# Patient Record
Sex: Male | Born: 1995 | Race: White | Hispanic: No | Marital: Single | Smoking: Never smoker
Health system: Southern US, Community
[De-identification: ages and names within clinical notes are randomized; demographics above are authoritative.]

## PROBLEM LIST (undated history)

## (undated) DIAGNOSIS — R569 Unspecified convulsions: Secondary | ICD-10-CM

## (undated) DIAGNOSIS — M419 Scoliosis, unspecified: Secondary | ICD-10-CM

## (undated) HISTORY — DX: Scoliosis, unspecified: M41.9

## (undated) HISTORY — DX: Unspecified convulsions: R56.9

---
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
## 2014-02-04 ENCOUNTER — Ambulatory Visit (INDEPENDENT_AMBULATORY_CARE_PROVIDER_SITE_OTHER): Payer: Medicaid Other | Admitting: Family Medicine

## 2014-02-04 ENCOUNTER — Encounter: Payer: Self-pay | Admitting: Family Medicine

## 2014-02-04 VITALS — BP 127/75 | HR 97 | Temp 98.1°F | Ht 71.5 in | Wt 134.2 lb

## 2014-02-04 DIAGNOSIS — M542 Cervicalgia: Secondary | ICD-10-CM

## 2014-02-04 DIAGNOSIS — M4120 Other idiopathic scoliosis, site unspecified: Secondary | ICD-10-CM

## 2014-02-04 MED ORDER — HYDROCODONE-ACETAMINOPHEN 5-325 MG PO TABS: 1.0000 | ORAL_TABLET | Freq: Four times a day (QID) | ORAL | Status: DC | PRN

## 2014-02-04 NOTE — Progress Notes (Signed)
   Subjective:    Patient ID: Joseph Arias A Doffing, male    DOB: 05/12/1995, 19 y.o.   MRN: [id number]  HPI Patient has hx of severe scoliosis and back pain.  [place]  Review of Systems  Constitutional: Negative for fever.  HENT: Negative for ear pain.   Eyes: Negative for discharge.  Respiratory: Negative for cough.   Cardiovascular: Negative for chest pain.  Gastrointestinal: Negative for abdominal distention.  Endocrine: Negative for polyuria.  Genitourinary: Negative for difficulty urinating.  Musculoskeletal: Negative for gait problem and neck pain.  Skin: Negative for color change and rash.  Neurological: Negative for speech difficulty and headaches.  Psychiatric/Behavioral: Negative for agitation.       Objective:    BP 127/75 mmHg  Pulse 97  Temp(Src) 98.1 F (36.7 C) (Oral)  Ht 5' 11.5" (1.816 m)  Wt 134 lb 3.2 oz (60.873 kg)  BMI 18.46 kg/m2 Physical Exam  Constitutional: He is oriented to person, place, and time. He appears well-developed and well-nourished.  HENT:  Head: Normocephalic and atraumatic.  Mouth/Throat: Oropharynx is clear and moist.  Eyes: Pupils are equal, round, and reactive to light.  Neck: Normal range of motion. Neck supple.  Cardiovascular: Normal rate and regular rhythm.   No murmur heard. Pulmonary/Chest: Effort normal and breath sounds normal.  Abdominal: Soft. Bowel sounds are normal. There is no tenderness.  Musculoskeletal:  TTP thoracic and cervical spine. Decreased ROM thoracic and cervical spine. Scoliosis thoracic spine. Decreased strength with left upper extremity  Neurological: He is alert and oriented to person, place, and time.  Skin: Skin is warm and dry.  Psychiatric: He has a normal mood and affect.          Assessment & Plan:     ICD-9-CM ICD-10-CM   1. Cervicalgia 723.1 M54.2 HYDROcodone-acetaminophen (NORCO) 5-325 MG per tablet  2. Scoliosis (and kyphoscoliosis), idiopathic 737.30  M41.20 HYDROcodone-acetaminophen (NORCO) 5-325 MG per tablet     No Follow-up on file.  Deatra CanterWilliam J Virgen Belland FNP

## 2014-02-05 ENCOUNTER — Telehealth: Payer: Self-pay | Admitting: Family Medicine

## 2014-02-05 ENCOUNTER — Other Ambulatory Visit: Payer: Self-pay | Admitting: Family Medicine

## 2014-02-05 DIAGNOSIS — M5010 Cervical disc disorder with radiculopathy, unspecified cervical region: Secondary | ICD-10-CM

## 2014-02-16 ENCOUNTER — Telehealth: Payer: Self-pay | Admitting: Family Medicine

## 2014-02-18 ENCOUNTER — Other Ambulatory Visit: Payer: Self-pay | Admitting: Family Medicine

## 2014-02-18 ENCOUNTER — Ambulatory Visit (HOSPITAL_COMMUNITY)
Admission: RE | Admit: 2014-02-18 | Discharge: 2014-02-18 | Disposition: A | Discharge status: Home / Self Care | Payer: Medicaid Other | Source: Ambulatory Visit | Attending: Family Medicine | Admitting: Family Medicine

## 2014-02-18 DIAGNOSIS — M5010 Cervical disc disorder with radiculopathy, unspecified cervical region: Secondary | ICD-10-CM

## 2014-02-18 DIAGNOSIS — M542 Cervicalgia: Secondary | ICD-10-CM | POA: Insufficient documentation

## 2014-02-18 DIAGNOSIS — M4120 Other idiopathic scoliosis, site unspecified: Secondary | ICD-10-CM

## 2014-02-18 MED ORDER — HYDROCODONE-ACETAMINOPHEN 5-325 MG PO TABS: 1.0000 | ORAL_TABLET | Freq: Four times a day (QID) | ORAL | Status: DC | PRN

## 2014-02-18 NOTE — Telephone Encounter (Signed)
Was given #30 on 02/04/14

## 2014-03-25 ENCOUNTER — Other Ambulatory Visit: Payer: Self-pay | Admitting: Family Medicine

## 2014-03-25 NOTE — Telephone Encounter (Signed)
Last seen 02/04/14, last filled by [facility] on 02/18/14

## 2014-03-25 NOTE — Telephone Encounter (Signed)
Patient needs to be seen.

## 2014-03-26 NOTE — Telephone Encounter (Signed)
Left message that this isn't a long term medication and that he will need to be seen to address need for refills.

## 2014-04-07 ENCOUNTER — Other Ambulatory Visit: Payer: Self-pay | Admitting: Family Medicine

## 2014-04-07 ENCOUNTER — Ambulatory Visit (INDEPENDENT_AMBULATORY_CARE_PROVIDER_SITE_OTHER): Payer: Medicaid Other | Admitting: Family Medicine

## 2014-04-07 ENCOUNTER — Ambulatory Visit (INDEPENDENT_AMBULATORY_CARE_PROVIDER_SITE_OTHER): Payer: Medicaid Other

## 2014-04-07 ENCOUNTER — Encounter: Payer: Self-pay | Admitting: Family Medicine

## 2014-04-07 VITALS — BP 128/81 | HR 102 | Temp 97.3°F | Ht 71.0 in | Wt 131.0 lb

## 2014-04-07 DIAGNOSIS — S20219A Contusion of unspecified front wall of thorax, initial encounter: Secondary | ICD-10-CM

## 2014-04-07 MED ORDER — NABUMETONE 500 MG PO TABS: 1000.0000 mg | ORAL_TABLET | Freq: Two times a day (BID) | ORAL | Status: DC | PRN

## 2014-04-07 NOTE — Progress Notes (Signed)
Subjective:  Patient ID: Joseph Arias, male    DOB: 06/16/1995  Age: 19 y.o. MRN: [id number]  CC: bilateral side pain   HPI Selena BattenCody A Ovens presents for 4 days ago, March 12, onset of bilateral chest pain. Patient is rather evasive about how this occurred but apparently he fell when someone fell on top of him and may have hit him. It is painful to take a deep breath. Painful to touch. Painful for motion. Denies any hematemesis or hemoptysis. He has not taken anything to give relief. He denies having had any seizure activity.   History Selena BattenCody has a past medical history of Seizures and Scoliosis.   He has no past surgical history on file.   His family history includes Hypertension in his mother.He reports that he has never smoked. He does not have any smokeless tobacco history on file. He reports that he does not drink alcohol or use illicit drugs.  Current Outpatient Prescriptions on File Prior to Visit  Medication Sig Dispense Refill  . divalproex (DEPAKOTE ER) 500 MG 24 hr tablet Take 1,500 mg by mouth daily.    Marland Kitchen. LAMICTAL XR 100 MG TB24   11  . HYDROcodone-acetaminophen (NORCO) 5-325 MG per tablet Take 1 tablet by mouth every 6 (six) hours as needed for moderate pain. (Patient not taking: Reported on 04/07/2014) 30 tablet 0   No current facility-administered medications on file prior to visit.    ROS Review of Systems  Constitutional: Negative for fever, chills and diaphoresis.  Respiratory: Negative for cough.   Cardiovascular: Positive for chest pain (pain is located atthe anterior axillary line on the right at rib 10-11. On the left. Pain is also in the anterior axillary line at approximately 9-10. There is point tenderness in each of these areas. There is no visible hematoma).  Gastrointestinal: Negative for nausea, vomiting, abdominal pain, diarrhea, constipation, blood in stool and abdominal distention.  Genitourinary: Negative for dysuria, hematuria and flank pain.  Musculoskeletal:  Negative for joint swelling and arthralgias.  Skin: Negative for rash.  Neurological: Negative for dizziness and weakness.  Psychiatric/Behavioral: The patient is not nervous/anxious.     Objective:  BP 128/81 mmHg  Pulse 102  Temp(Src) 97.3 F (36.3 C) (Oral)  Ht 5\' 11"  (1.803 m)  Wt 131 lb (59.421 kg)  BMI 18.28 kg/m2  Physical Exam  Constitutional: He appears well-developed and well-nourished.  HENT:  Head: Normocephalic and atraumatic.  Right Ear: Tympanic membrane and external ear normal. No decreased hearing is noted.  Left Ear: Tympanic membrane and external ear normal. No decreased hearing is noted.  Nose: Mucosal edema present. Right sinus exhibits no frontal sinus tenderness. Left sinus exhibits no frontal sinus tenderness.  Mouth/Throat: No oropharyngeal exudate or posterior oropharyngeal erythema.  Neck: No Brudzinski's sign noted.  Pulmonary/Chest: Breath sounds normal. No respiratory distress. He exhibits tenderness (tender in the previously described areas. No visible hematoma.).  Lymphadenopathy:       Head (right side): No preauricular adenopathy present.       Head (left side): No preauricular adenopathy present.       Right cervical: No superficial cervical adenopathy present.      Left cervical: No superficial cervical adenopathy present.    Assessment & Plan:   Selena BattenCody was seen today for bilateral side pain.  Diagnoses and all orders for this visit:  Contusion, chest wall, unspecified laterality, initial encounter Orders: -     Cancel: DG Ribs Unilateral Left; Future -  Cancel: DG Ribs Unilateral Right; Future -     DG Ribs Bilateral W/Chest; Future  Other orders -     nabumetone (RELAFEN) 500 MG tablet; Take 2 tablets (1,000 mg total) by mouth 2 (two) times daily as needed for moderate pain.   I am having Mr. Maeda start on nabumetone. I am also having him maintain his divalproex, LAMICTAL XR, and HYDROcodone-acetaminophen.  Meds ordered this  encounter  Medications  . nabumetone (RELAFEN) 500 MG tablet    Sig: Take 2 tablets (1,000 mg total) by mouth 2 (two) times daily as needed for moderate pain.    Dispense:  60 tablet    Refill:  0   Rib films show no apparent fracture on preliminary reading by me.  Follow-up: Return if symptoms worsen or fail to improve.  Mechele Claude, M.D.

## 2014-04-08 MED ORDER — SULINDAC 150 MG PO TABS: 150.0000 mg | ORAL_TABLET | Freq: Two times a day (BID) | ORAL | Status: DC

## 2014-04-08 MED ORDER — DICLOFENAC SODIUM 75 MG PO TBEC: 75.0000 mg | DELAYED_RELEASE_TABLET | Freq: Two times a day (BID) | ORAL | Status: DC

## 2014-04-08 NOTE — Telephone Encounter (Signed)
Per patient insurance will not pay for nabumetone. Can this med be changed to something else. Please advise and route to [facility]

## 2014-04-08 NOTE — Addendum Note (Signed)
Addended by: Bearl MulberryUTHERFORD, Amante Fomby K on: 04/08/2014 11:39 AM   Modules accepted: Orders

## 2014-04-08 NOTE — Telephone Encounter (Signed)
Patient aware.

## 2014-04-08 NOTE — Telephone Encounter (Signed)
Diclofenac was sent in as a substitute area. Thanks, WS.

## 2015-09-30 ENCOUNTER — Encounter (HOSPITAL_COMMUNITY): Payer: Self-pay | Admitting: Emergency Medicine

## 2015-09-30 ENCOUNTER — Emergency Department (HOSPITAL_COMMUNITY)
Admission: EM | Admit: 2015-09-30 | Discharge: 2015-09-30 | Disposition: A | Discharge status: Home / Self Care | Payer: PRIVATE HEALTH INSURANCE | Attending: Emergency Medicine | Admitting: Emergency Medicine

## 2015-09-30 ENCOUNTER — Emergency Department (HOSPITAL_COMMUNITY): Payer: PRIVATE HEALTH INSURANCE

## 2015-09-30 DIAGNOSIS — Z791 Long term (current) use of non-steroidal anti-inflammatories (NSAID): Secondary | ICD-10-CM | POA: Diagnosis not present

## 2015-09-30 DIAGNOSIS — M542 Cervicalgia: Secondary | ICD-10-CM | POA: Diagnosis present

## 2015-09-30 DIAGNOSIS — M62838 Other muscle spasm: Secondary | ICD-10-CM | POA: Diagnosis not present

## 2015-09-30 DIAGNOSIS — Z79899 Other long term (current) drug therapy: Secondary | ICD-10-CM | POA: Diagnosis not present

## 2015-09-30 MED ORDER — HYDROCODONE-ACETAMINOPHEN 5-325 MG PO TABS
1.0000 | ORAL_TABLET | Freq: Once | ORAL | Status: AC
  Administered 2015-09-30: 1 via ORAL
  Filled 2015-09-30: qty 1

## 2015-09-30 MED ORDER — CYCLOBENZAPRINE HCL 5 MG PO TABS: 5.0000 mg | ORAL_TABLET | Freq: Three times a day (TID) | ORAL | 0 refills | Status: DC | PRN

## 2015-09-30 MED ORDER — HYDROCODONE-ACETAMINOPHEN 5-325 MG PO TABS: 1.0000 | ORAL_TABLET | ORAL | 0 refills | Status: DC | PRN

## 2015-09-30 NOTE — Discharge Instructions (Signed)
You may take the hydrocodone prescribed for pain relief.  This will make you drowsy - do not drive within 4 hours of taking this medication. ° °

## 2015-09-30 NOTE — ED Triage Notes (Signed)
PT c/o waking up with neck pain yesterday morning with no new injury. PT also states he sees a spine specialist every 6 months for scoliosis treatment.

## 2015-09-30 NOTE — ED Provider Notes (Signed)
[facility]-EMER[facility]ENCY DEPT Provider Note   CSN: [id number] Arrival date & time: 09/30/15  1523     History   Chief Complaint Chief Complaint  Patient presents with  . Neck Pain    HPI Joseph Arias is a 20 y.o. male with a history of scoliosis, followed by a back specialist with [facility] yearly, presenting with a 2 day history of right neck pain.  He denies injury, stating he woke yesterday with pain and decreased range of motion.  He denies radiation of pain with pain worsened with right head rotation and also denies fevers, chills, headaches.  He has taken advil, tried heat and ice without improvement.  The history is provided by the patient and a parent.    Past Medical History:  Diagnosis Date  . Scoliosis   . Seizures (HCC)     There are no active problems to display for this patient.   History reviewed. No pertinent surgical history.     Home Medications    Prior to Admission medications   Medication Sig Start Date End Date Taking? Authorizing Provider  divalproex (DEPAKOTE ER) 500 MG 24 hr tablet Take 1,000 mg by mouth at bedtime.    Yes Historical Provider, MD  ibuprofen (ADVIL,MOTRIN) 200 MG tablet Take 800 mg by mouth every 6 (six) hours as needed for mild pain or moderate pain.   Yes Historical Provider, MD  LAMICTAL XR 100 MG TB24 Take 100 mg by mouth at bedtime.  01/18/14  Yes Historical Provider, MD  cyclobenzaprine (FLEXERIL) 5 MG tablet Take 1 tablet (5 mg total) by mouth 3 (three) times daily as needed for muscle spasms. 09/30/15   Burgess Amor, PA-C  HYDROcodone-acetaminophen (NORCO/VICODIN) 5-325 MG tablet Take 1 tablet by mouth every 4 (four) hours as needed. 09/30/15   Burgess Amor, PA-C    Family History Family History  Problem Relation Age of Onset  . Hypertension Mother     Social History Social History  Substance Use Topics  . Smoking status: Never Smoker  . Smokeless tobacco: Never Used  . Alcohol use Yes     Comment: socially     Allergies     Review of patient's allergies indicates no known allergies.   Review of Systems Review of Systems  Constitutional: Negative for chills and fever.  Musculoskeletal: Positive for arthralgias and neck stiffness. Negative for joint swelling and myalgias.  Neurological: Negative for weakness and numbness.     Physical Exam Updated Vital Signs BP 124/67 (BP Location: Left Arm)   Pulse 93   Temp 98.1 F (36.7 C) (Temporal)   Resp 16   Ht 5\' 6"  (1.676 m)   Wt 63.9 kg   SpO2 100%   BMI 22.74 kg/m   Physical Exam  Constitutional: He appears well-developed and well-nourished.  HENT:  Head: Normocephalic.  Eyes: Conjunctivae are normal.  Neck: Normal range of motion. Neck supple.  Cardiovascular: Normal rate and intact distal pulses.   Pedal pulses normal.  Pulmonary/Chest: Effort normal.  Abdominal: Soft. Bowel sounds are normal. He exhibits no distension and no mass.  Musculoskeletal: He exhibits no edema.       Cervical back: He exhibits decreased range of motion, tenderness and spasm. He exhibits no bony tenderness, no swelling and no deformity.       Lumbar back: He exhibits no tenderness, no swelling, no edema and no spasm.  Neurological: He is alert. He has normal strength. He displays no atrophy and no tremor. No sensory  deficit. Gait normal.  Reflex Scores:      Bicep reflexes are 2+ on the right side and 2+ on the left side. No strength deficit noted in hip and knee flexor and extensor muscle groups.  Ankle flexion and extension intact.  Skin: Skin is warm and dry.  Psychiatric: He has a normal mood and affect.  Nursing note and vitals reviewed.    ED Treatments / Results  Labs (all labs ordered are listed, but only abnormal results are displayed) Labs Reviewed - No data to display  EKG  EKG Interpretation None       Radiology Dg Cervical Spine Complete  Result Date: 09/30/2015 CLINICAL DATA:  Neck pain for 2 days, scoliosis history EXAM: CERVICAL SPINE -  COMPLETE 4+ VIEW COMPARISON:  MR cervical spine 02/18/2014 FINDINGS: Prevertebral soft tissues normal thickness. Reversal of cervical lordosis question muscle spasm. Vertebral body and disc space heights maintained. No acute fracture, subluxation, or bone destruction. Bony foramina patent. Lung apices clear. Thoracic levoconvex scoliosis is partially visualized. IMPRESSION: Question muscle spasm ; otherwise negative exam. Electronically Signed   By: Ulyses SouthwardMark  Boles M.D.   On: 09/30/2015 17:13    Procedures Procedures (including critical care time)  Medications Ordered in ED Medications  HYDROcodone-acetaminophen (NORCO/VICODIN) 5-325 MG per tablet 1 tablet (1 tablet Oral Given 09/30/15 1637)     Initial Impression / Assessment and Plan / ED Course  I have reviewed the triage vital signs and the nursing notes.  Pertinent labs & imaging results that were available during my care of the patient were reviewed by me and considered in my medical decision making (see chart for details).  Clinical Course    Imaging reviewed. Pt prescribed flexeril, hydrocodone.  Heat followed by ROM exercises which were demonstrated.  F/u with pcp and/or scoliosis specialist if sx persist or worsen.  Final Clinical Impressions(s) / ED Diagnoses   Final diagnoses:  Muscle spasms of neck    New Prescriptions New Prescriptions   CYCLOBENZAPRINE (FLEXERIL) 5 MG TABLET    Take 1 tablet (5 mg total) by mouth 3 (three) times daily as needed for muscle spasms.   HYDROCODONE-ACETAMINOPHEN (NORCO/VICODIN) 5-325 MG TABLET    Take 1 tablet by mouth every 4 (four) hours as needed.     Burgess AmorJulie Laiah Pouncey, PA-C 09/30/15 1735    Donnetta HutchingBrian Cook, MD 09/30/15 [phone]

## 2016-04-29 ENCOUNTER — Emergency Department (HOSPITAL_COMMUNITY): Payer: 59

## 2016-04-29 ENCOUNTER — Encounter (HOSPITAL_COMMUNITY): Payer: Self-pay | Admitting: Emergency Medicine

## 2016-04-29 ENCOUNTER — Emergency Department (HOSPITAL_COMMUNITY)
Admission: EM | Admit: 2016-04-29 | Discharge: 2016-04-29 | Disposition: A | Discharge status: Home / Self Care | Payer: 59 | Attending: Emergency Medicine | Admitting: Emergency Medicine

## 2016-04-29 DIAGNOSIS — S50811A Abrasion of right forearm, initial encounter: Secondary | ICD-10-CM | POA: Diagnosis not present

## 2016-04-29 DIAGNOSIS — Z791 Long term (current) use of non-steroidal anti-inflammatories (NSAID): Secondary | ICD-10-CM | POA: Insufficient documentation

## 2016-04-29 DIAGNOSIS — Y929 Unspecified place or not applicable: Secondary | ICD-10-CM | POA: Insufficient documentation

## 2016-04-29 DIAGNOSIS — Y999 Unspecified external cause status: Secondary | ICD-10-CM | POA: Insufficient documentation

## 2016-04-29 DIAGNOSIS — W109XXA Fall (on) (from) unspecified stairs and steps, initial encounter: Secondary | ICD-10-CM | POA: Diagnosis not present

## 2016-04-29 DIAGNOSIS — Y939 Activity, unspecified: Secondary | ICD-10-CM | POA: Diagnosis not present

## 2016-04-29 DIAGNOSIS — S59911A Unspecified injury of right forearm, initial encounter: Secondary | ICD-10-CM | POA: Diagnosis present

## 2016-04-29 DIAGNOSIS — W19XXXA Unspecified fall, initial encounter: Secondary | ICD-10-CM

## 2016-04-29 DIAGNOSIS — M79631 Pain in right forearm: Secondary | ICD-10-CM

## 2016-04-29 NOTE — ED Provider Notes (Signed)
[facility] DEPT Provider Note   CSN: [id number] Arrival date & time: 04/29/16  1108     History   Chief Complaint Chief Complaint  Patient presents with  . Fall    HPI Joseph Arias is a 21 y.o. male with history of seizures presents to the ED reporting right forearm and right wrist pain after accidental fall down stairs last night. Patient states that he tried to catch himself and fell onto his outstretched right hand. He denies seizure prior to fall. Pain was immediate and continued overnight. Patient has taken ibuprofen and 100 mg with minimal relief. Patient worried because he noticed a "bump" to the posterior forearm. Previous surgeries or trauma to the right upper extremity. Patient is left-handed. No numbness, weakness or tingling to right UE.  HPI  Past Medical History:  Diagnosis Date  . Scoliosis   . Seizures ([facility])     There are no active problems to display for this patient.   History reviewed. No pertinent surgical history.     Home Medications    Prior to Admission medications   Medication Sig Start Date End Date Taking? Authorizing Provider  divalproex (DEPAKOTE ER) 500 MG 24 hr tablet Take 1,000 mg by mouth at bedtime.    Yes Historical Provider, MD  ibuprofen (ADVIL,MOTRIN) 200 MG tablet Take 800 mg by mouth every 6 (six) hours as needed for mild pain or moderate pain.   Yes Historical Provider, MD  LAMICTAL XR 100 MG TB24 Take 100 mg by mouth at bedtime.  01/18/14  Yes Historical Provider, MD    Family History Family History  Problem Relation Age of Onset  . Hypertension Mother     Social History Social History  Substance Use Topics  . Smoking status: Never Smoker  . Smokeless tobacco: Never Used  . Alcohol use Yes     Comment: socially     Allergies   Patient has no known allergies.   Review of Systems Review of Systems  Musculoskeletal: Positive for arthralgias. Negative for back pain and joint swelling.  Skin: Positive for rash.    Neurological: Negative for seizures, syncope and headaches.     Physical Exam Updated Vital Signs BP 107/73   Pulse (!) 101   Temp 97.5 F (36.4 C) (Oral)   Resp 18   Ht 6' (1.829 m)   Wt 72.7 kg   SpO2 100%   BMI 21.73 kg/m   Physical Exam  Constitutional: He appears well-developed and well-nourished. No distress.  HENT:  Head: Normocephalic and atraumatic.  Eyes: Conjunctivae and EOM are normal.  Neck: Normal range of motion.  Cardiovascular: Normal rate, regular rhythm and normal heart sounds.   No murmur heard. Radial and ulnar pulses strong and symmetric bilaterally  Pulmonary/Chest: Effort normal and breath sounds normal. He has no wheezes.  Musculoskeletal: He exhibits tenderness.       Right forearm: He exhibits tenderness and swelling.  RIGHT UPPER EXTREMITY Point tenderness to ulnar aspect of mid forearm with edema  Full passive ROM of right elbow, wrist and digits Patient able to supinate and pronate forearm, with appropriate tenderness No tenderness at anatomical snuffbox No focal tenderness at distal ulna, radius, olecranon or radial head  Neurological:  RIGHT UPPER EXTREMITY 5/5 strength with elbow flexion and extension 5/5 strength with wrist flexion and extension Strong hand grip and pincer strength Sensation to light touch intact in radial, medial and ulnar distribution   Skin: Abrasion noted.     RIGHT  UPPER EXTREMITY Small abrasion to dorsal aspect of right mid forearm with appropriate tenderness     ED Treatments / Results  Labs (all labs ordered are listed, but only abnormal results are displayed) Labs Reviewed - No data to display  EKG  EKG Interpretation None       Radiology Dg Forearm Right  Result Date: 04/29/2016 CLINICAL DATA:  21 year old male with a history of fall.  Wrist pain EXAM: RIGHT FOREARM - 2 VIEW COMPARISON:  None. FINDINGS: There is no evidence of fracture or other focal bone lesions. Soft tissues are  unremarkable. IMPRESSION: Negative. Electronically Signed   By: Gilmer Mor D.O.   On: 04/29/2016 12:12   Dg Wrist Complete Right  Result Date: 04/29/2016 CLINICAL DATA:  21 year old male with a history of wrist pain after fall EXAM: RIGHT WRIST - COMPLETE 3+ VIEW COMPARISON:  None. FINDINGS: There is no evidence of fracture or dislocation. There is no evidence of arthropathy or other focal bone abnormality. Soft tissues are unremarkable. IMPRESSION: Negative. Electronically Signed   By: Gilmer Mor D.O.   On: 04/29/2016 12:14    Procedures Procedures (including critical care time)  Medications Ordered in ED Medications - No data to display   Initial Impression / Assessment and Plan / ED Course  I have reviewed the triage vital signs and the nursing notes.  Pertinent labs & imaging results that were available during my care of the patient were reviewed by me and considered in my medical decision making (see chart for details).  Clinical Course as of Apr 29 1240  Sat Apr 29, 2016  1220 FINDINGS: There is no evidence of fracture or dislocation. There is no evidence of arthropathy or other focal bone abnormality. Soft tissues are unremarkable.  IMPRESSION: Negative. DG Wrist Complete Right [CG]  1220 FINDINGS: There is no evidence of fracture or other focal bone lesions. Soft tissues are unremarkable.  IMPRESSION: Negative. DG Forearm Right [CG]  1227 Discussed x-ray findings with patient and relative at beside, patient requested wrist brace.  [CG]    Clinical Course User Index [CG] Liberty Handy, PA-C   X-ray negative. RUE neurovascularly intact. Suspect pain secondary to hematoma to dorsal right forearm.  Patient requested wrist brace, given. Ibuprofen, ice, rest, elevate.  Advised to f/u with PCP if pain persists after a week.  Ed return precautions given. Patient and relative at bedside agreeable to plan.   Final Clinical Impressions(s) / ED Diagnoses   Final  diagnoses:  Pain of right forearm  Fall, initial encounter    New Prescriptions New Prescriptions   No medications on file     Jerrell Mylar 04/29/16 1242    Eber Hong, MD 04/29/16 [phone]

## 2016-04-29 NOTE — ED Triage Notes (Signed)
Pt reports falling down the stairs yesterday and catching self with right arm.  Pain from wrist to elbow.

## 2016-04-29 NOTE — Discharge Instructions (Signed)
Your forearm and wrist x-rays were negative today and did not show a fracture dislocation. The focal pain that you're experiencing to the right forearm is likely from a hematoma or bruise. Please take 600 mg of ibuprofen every 6-8 hours for pain. Rest your left right upper extremity. You requested a wrist/arm brace which was provided to you today for comfort. Please wear this for comfort and until you are pain free. Please follow-up with your primary care provider in the next week if your symptoms persist and do not completely go away.  Return to the emergency department if you develop right upper extremity weakness or numbness.

## 2016-04-29 NOTE — ED Notes (Signed)
Pt with right forearm pain after slipping on stairs yesterday and tried to catch self with right hand and arm.  Pt able to make fist and rotate forearm.  No swelling noted but abrasion to forearm.

## 2016-10-27 ENCOUNTER — Ambulatory Visit (INDEPENDENT_AMBULATORY_CARE_PROVIDER_SITE_OTHER): Payer: 59 | Admitting: Family Medicine

## 2016-10-27 ENCOUNTER — Encounter: Payer: Self-pay | Admitting: Family Medicine

## 2016-10-27 VITALS — BP 117/73 | HR 88 | Temp 96.9°F | Ht 72.0 in | Wt 161.0 lb

## 2016-10-27 DIAGNOSIS — R109 Unspecified abdominal pain: Secondary | ICD-10-CM

## 2016-10-27 LAB — URINALYSIS
BILIRUBIN UA: NEGATIVE
Glucose, UA: NEGATIVE
Ketones, UA: NEGATIVE
LEUKOCYTES UA: NEGATIVE
NITRITE UA: NEGATIVE
PH UA: 6 (ref 5.0–7.5)
Protein, UA: NEGATIVE
SPEC GRAV UA: 1.025 (ref 1.005–1.030)
Urobilinogen, Ur: 0.2 mg/dL (ref 0.2–1.0)

## 2016-10-27 NOTE — Progress Notes (Signed)
Subjective:  Patient ID: Joseph Arias, male    DOB: [birth year]  Age: 21 y.o. MRN: [id number]  CC: Flank Pain (left side )   HPI Joseph Arias presents for 2 days of increasing left flank pain. He's been nauseous as well. The pain is a little better when he lays still but it's even more severe when he moves around even to sit up or walk. He says his urine burns and his bowel movements aren't as well he's passed some blood with his bowel movements as well. No previous similar symptoms. He is concerned that he drinks a large amount of red bull is concerned it caused these symptoms.  Depression screen PHQ 2/9 10/27/2016  Decreased Interest 0  Down, Depressed, Hopeless 0  PHQ - 2 Score 0    History Valery has a past medical history of Scoliosis and Seizures ([facility]).   He has no past surgical history on file.   His family history includes Hypertension in his mother.He reports that he has never smoked. He has never used smokeless tobacco. He reports that he drinks alcohol. He reports that he does not use drugs.    ROS Review of Systems  Constitutional: Negative.   Respiratory: Negative.   Cardiovascular: Negative.   Gastrointestinal: Positive for abdominal pain, nausea and rectal pain. Negative for diarrhea and vomiting.  Genitourinary: Positive for flank pain.    Objective:  BP 117/73 (BP Location: Left Arm)   Pulse 88   Temp (!) 96.9 F (36.1 C) (Oral)   Ht 6' (1.829 m)   Wt 161 lb (73 kg)   BMI 21.84 kg/m   BP Readings from Last 3 Encounters:  10/27/16 117/73  04/29/16 107/73  09/30/15 124/67    Wt Readings from Last 3 Encounters:  10/27/16 161 lb (73 kg)  04/29/16 160 lb 3.2 oz (72.7 kg)  09/30/15 140 lb 14.4 oz (63.9 kg)     Physical Exam  Constitutional: He appears well-developed and well-nourished.  HENT:  Head: Normocephalic and atraumatic.  Right Ear: Tympanic membrane and external ear normal. No decreased hearing is noted.  Left Ear: Tympanic membrane and  external ear normal. No decreased hearing is noted.  Mouth/Throat: No oropharyngeal exudate or posterior oropharyngeal erythema.  Eyes: Pupils are equal, round, and reactive to light.  Neck: Normal range of motion. Neck supple.  Cardiovascular: Normal rate and regular rhythm.   No murmur heard. Pulmonary/Chest: Breath sounds normal. No respiratory distress.  Abdominal: Soft. Bowel sounds are normal. He exhibits no mass. There is tenderness (moderate left lower quadrant tendernesssevere at posterior axillary line about rib 11. No visible lesions). There is guarding.  Psychiatric: His affect is blunt. His speech is delayed. He is slowed. Cognition and memory are normal.  Vitals reviewed.    Urinalysis: all parameters nml except trace blood   Assessment & Plan:   Reichen was seen today for flank pain.  Diagnoses and all orders for this visit:  Flank pain -     Urine Culture -     Urinalysis -     CBC with Differential/Platelet -     CMP14+EGFR  Abdominal pain, unspecified abdominal location -     CT RENAL STONE STUDY; Future       I am having Mr. Hadsall maintain his divalproex, LAMICTAL XR, and ibuprofen.  Allergies as of 10/27/2016   No Known Allergies     Medication List       Accurate as of 10/27/16  8:34  PM. Always use your most recent med list.          divalproex 500 MG 24 hr tablet Commonly known as:  DEPAKOTE ER Take 1,000 mg by mouth at bedtime.   ibuprofen 200 MG tablet Commonly known as:  ADVIL,MOTRIN Take 800 mg by mouth every 6 (six) hours as needed for mild pain or moderate pain.   LAMICTAL XR 100 MG Tb24 Generic drug:  LamoTRIgine Take 100 mg by mouth at bedtime.        Follow-up: Return in about 1 week (around 11/03/2016), or if symptoms worsen or fail to improve.  Claretta Fraise, M.D.

## 2016-10-28 LAB — URINE CULTURE

## 2016-10-29 LAB — CMP14+EGFR
ALBUMIN: 4.7 g/dL (ref 3.5–5.5)
ALK PHOS: 55 IU/L (ref 39–117)
ALT: 21 IU/L (ref 0–44)
AST: 21 IU/L (ref 0–40)
Albumin/Globulin Ratio: 1.6 (ref 1.2–2.2)
BUN / CREAT RATIO: 10 (ref 9–20)
BUN: 10 mg/dL (ref 6–20)
Bilirubin Total: 0.3 mg/dL (ref 0.0–1.2)
CO2: 27 mmol/L (ref 20–29)
Calcium: 9.7 mg/dL (ref 8.7–10.2)
Chloride: 97 mmol/L (ref 96–106)
Creatinine, Ser: 0.98 mg/dL (ref 0.76–1.27)
GFR calc Af Amer: 126 mL/min/{1.73_m2} (ref >59)
GFR calc non Af Amer: 109 mL/min/{1.73_m2} (ref >59)
GLOBULIN, TOTAL: 3 g/dL (ref 1.5–4.5)
GLUCOSE: 96 mg/dL (ref 65–99)
POTASSIUM: 4.3 mmol/L (ref 3.5–5.2)
SODIUM: 139 mmol/L (ref 134–144)
Total Protein: 7.7 g/dL (ref 6.0–8.5)

## 2016-10-29 LAB — CBC WITH DIFFERENTIAL/PLATELET
BASOS: 1 %
Basophils Absolute: 0 10*3/uL (ref 0.0–0.2)
EOS (ABSOLUTE): 0.1 10*3/uL (ref 0.0–0.4)
EOS: 2 %
HEMATOCRIT: 46.1 % (ref 37.5–51.0)
HEMOGLOBIN: 15.7 g/dL (ref 13.0–17.7)
IMMATURE GRANS (ABS): 0 10*3/uL (ref 0.0–0.1)
Immature Granulocytes: 0 %
LYMPHS: 36 %
Lymphocytes Absolute: 2.4 10*3/uL (ref 0.7–3.1)
MCH: 32.2 pg (ref 26.6–33.0)
MCHC: 34.1 g/dL (ref 31.5–35.7)
MCV: 95 fL (ref 79–97)
MONOCYTES: 12 %
Monocytes Absolute: 0.8 10*3/uL (ref 0.1–0.9)
NEUTROS ABS: 3.4 10*3/uL (ref 1.4–7.0)
Neutrophils: 49 %
Platelets: 254 10*3/uL (ref 150–379)
RBC: 4.88 x10E6/uL (ref 4.14–5.80)
RDW: 13.8 % (ref 12.3–15.4)
WBC: 6.8 10*3/uL (ref 3.4–10.8)

## 2016-11-07 ENCOUNTER — Ambulatory Visit (HOSPITAL_COMMUNITY): Payer: 59

## 2017-06-01 IMAGING — DX DG WRIST COMPLETE 3+V*R*
4 series · 4 of 4 positions shown · non-contrast
Comparison: None.

CLINICAL DATA: 21-year-old male with a history of wrist pain after
fall

EXAM:
RIGHT WRIST - COMPLETE 3+ VIEW

[wrist pa]
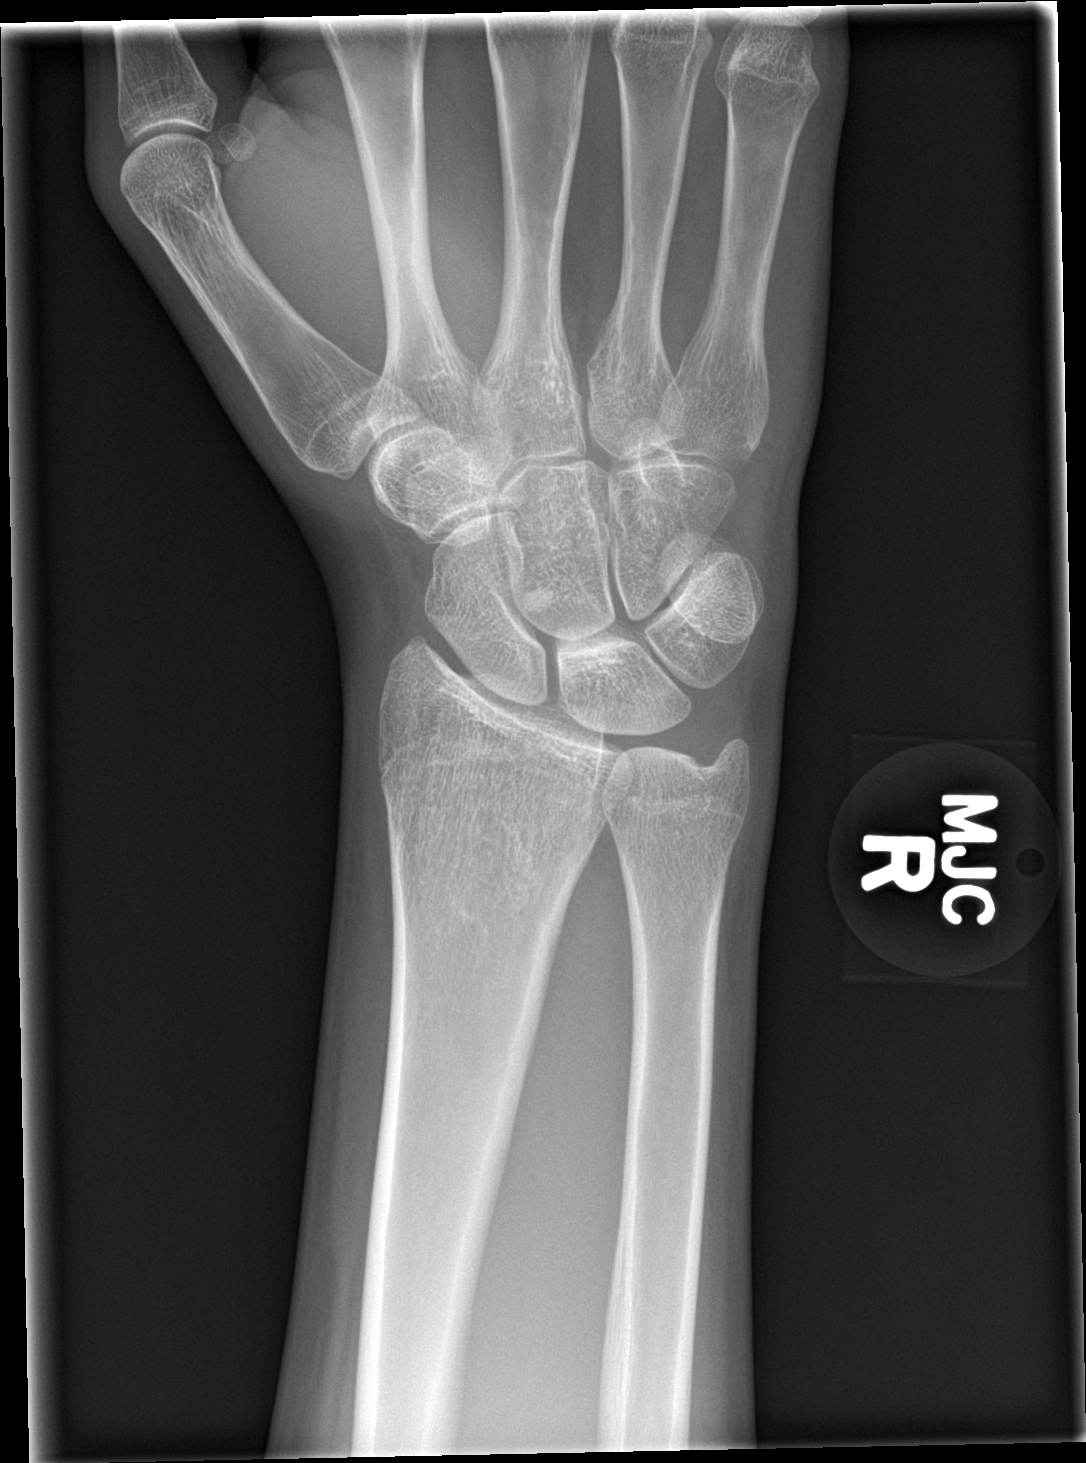

[wrist obl]
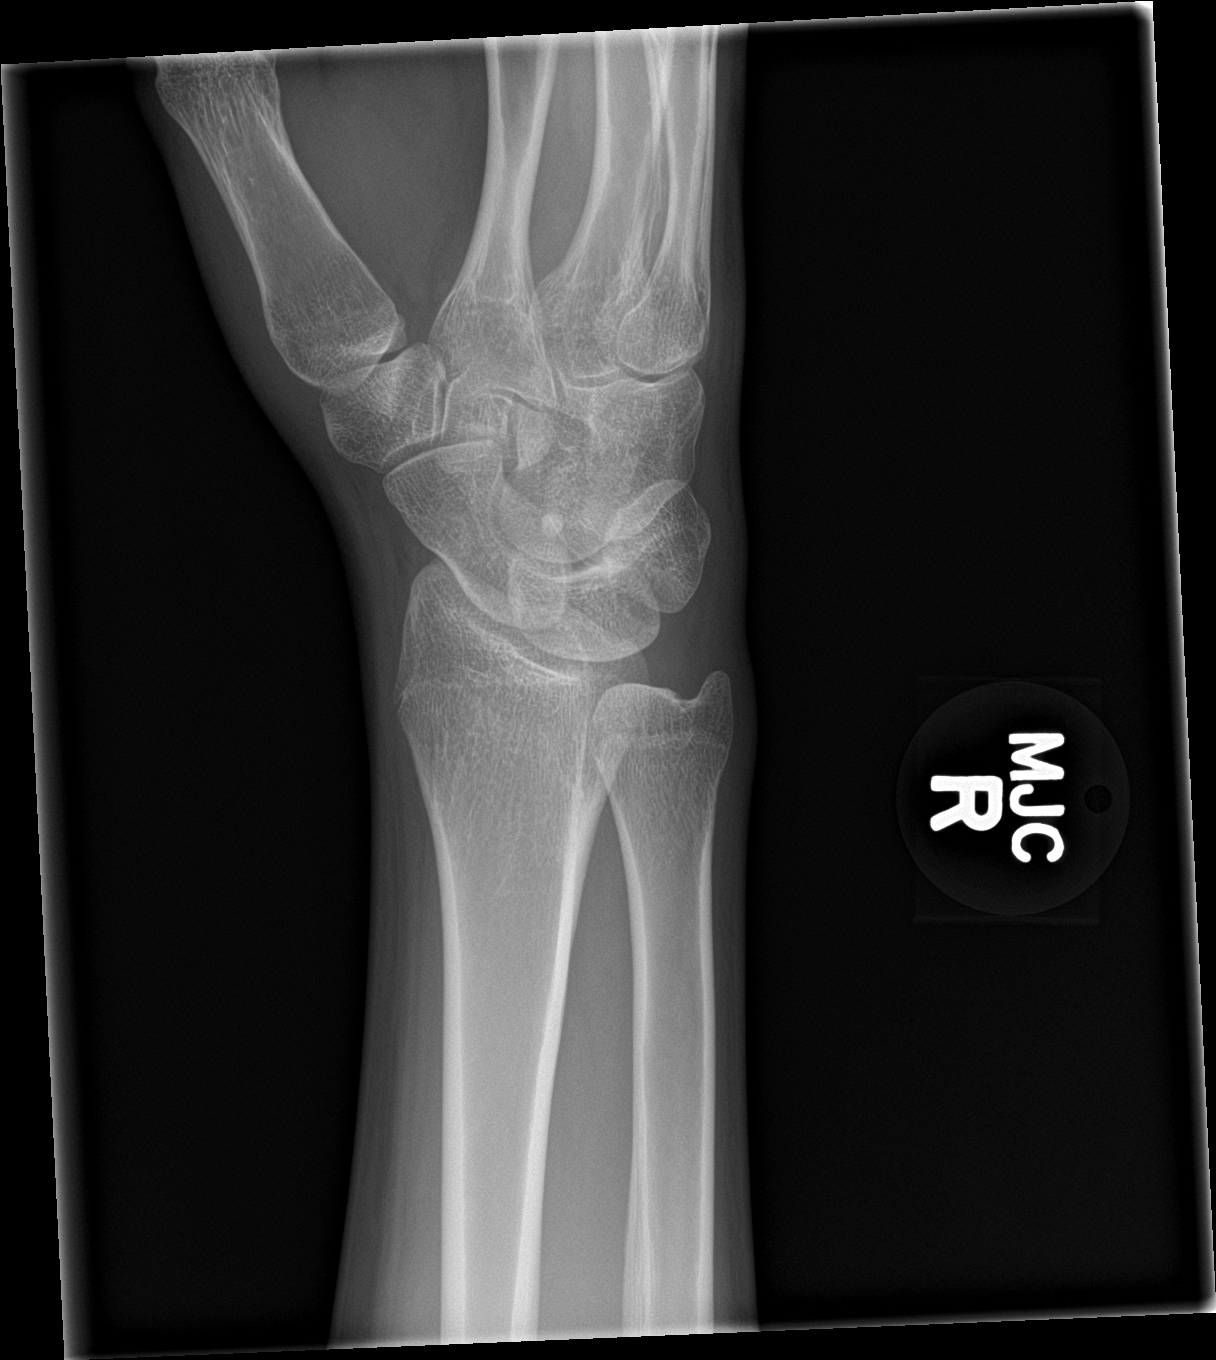

[wrist lat]
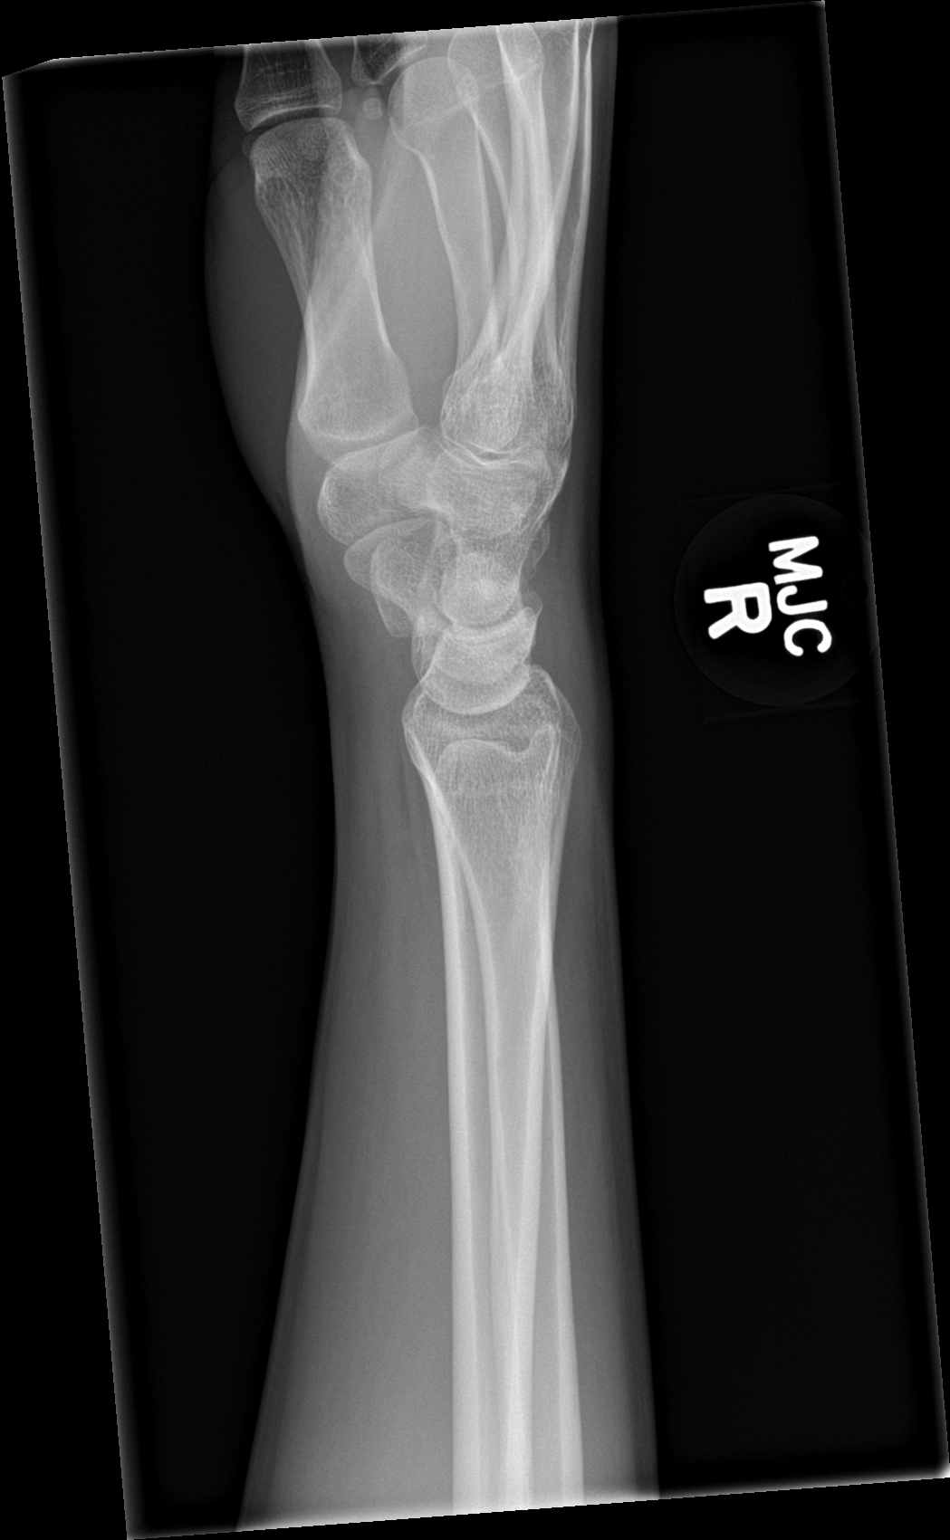

[wrist navicular]
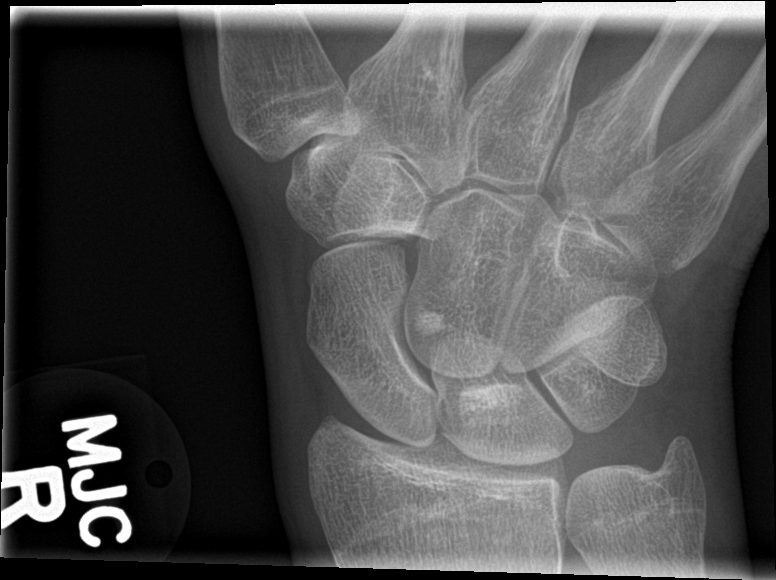

[4 of 4 positions shown; findings below may reference images not displayed]

FINDINGS: There is no evidence of fracture or dislocation. There is no
evidence of arthropathy or other focal bone abnormality. Soft
tissues are unremarkable.
IMPRESSION: Negative.
